# Patient Record
Sex: Female | Born: 1980 | Hispanic: Yes | Marital: Single | Smoking: Never smoker
Health system: Southern US, Community
[De-identification: ages and names within clinical notes are randomized; demographics above are authoritative.]

## PROBLEM LIST (undated history)

## (undated) HISTORY — PX: ABDOMINAL HYSTERECTOMY: SHX81

---

## 2013-10-26 ENCOUNTER — Emergency Department (HOSPITAL_COMMUNITY): Payer: No Typology Code available for payment source

## 2013-10-26 ENCOUNTER — Emergency Department (HOSPITAL_COMMUNITY)
Admission: EM | Admit: 2013-10-26 | Discharge: 2013-10-26 | Disposition: A | Discharge status: Home / Self Care | Payer: No Typology Code available for payment source | Attending: Emergency Medicine | Admitting: Emergency Medicine

## 2013-10-26 DIAGNOSIS — S161XXA Strain of muscle, fascia and tendon at neck level, initial encounter: Secondary | ICD-10-CM

## 2013-10-26 DIAGNOSIS — Y92410 Unspecified street and highway as the place of occurrence of the external cause: Secondary | ICD-10-CM | POA: Insufficient documentation

## 2013-10-26 DIAGNOSIS — S139XXA Sprain of joints and ligaments of unspecified parts of neck, initial encounter: Secondary | ICD-10-CM | POA: Insufficient documentation

## 2013-10-26 DIAGNOSIS — S298XXA Other specified injuries of thorax, initial encounter: Secondary | ICD-10-CM | POA: Insufficient documentation

## 2013-10-26 DIAGNOSIS — S6990XA Unspecified injury of unspecified wrist, hand and finger(s), initial encounter: Secondary | ICD-10-CM | POA: Insufficient documentation

## 2013-10-26 DIAGNOSIS — S335XXA Sprain of ligaments of lumbar spine, initial encounter: Secondary | ICD-10-CM | POA: Insufficient documentation

## 2013-10-26 DIAGNOSIS — S59919A Unspecified injury of unspecified forearm, initial encounter: Secondary | ICD-10-CM

## 2013-10-26 DIAGNOSIS — S59909A Unspecified injury of unspecified elbow, initial encounter: Secondary | ICD-10-CM | POA: Insufficient documentation

## 2013-10-26 DIAGNOSIS — T148XXA Other injury of unspecified body region, initial encounter: Secondary | ICD-10-CM

## 2013-10-26 DIAGNOSIS — Y9389 Activity, other specified: Secondary | ICD-10-CM | POA: Insufficient documentation

## 2013-10-26 DIAGNOSIS — S0990XA Unspecified injury of head, initial encounter: Secondary | ICD-10-CM | POA: Insufficient documentation

## 2013-10-26 DIAGNOSIS — S39012A Strain of muscle, fascia and tendon of lower back, initial encounter: Secondary | ICD-10-CM

## 2013-10-26 MED ORDER — HYDROCODONE-ACETAMINOPHEN 5-325 MG PO TABS: 2.0000 | ORAL_TABLET | ORAL | Status: DC | PRN

## 2013-10-26 MED ORDER — CYCLOBENZAPRINE HCL 10 MG PO TABS: 10.0000 mg | ORAL_TABLET | Freq: Two times a day (BID) | ORAL | Status: DC | PRN

## 2013-10-26 MED ORDER — IBUPROFEN 800 MG PO TABS
800.0000 mg | ORAL_TABLET | Freq: Once | ORAL | Status: AC
  Administered 2013-10-26: 800 mg via ORAL
  Filled 2013-10-26: qty 1

## 2013-10-26 MED ORDER — HYDROCODONE-ACETAMINOPHEN 5-325 MG PO TABS
1.0000 | ORAL_TABLET | Freq: Once | ORAL | Status: AC
  Administered 2013-10-26: 1 via ORAL
  Filled 2013-10-26: qty 1
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
## 2013-10-26 NOTE — ED Notes (Signed)
Bed: WA23 Expected date:  Expected time:  Means of arrival:  Comments: EMS 
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
## 2013-10-26 NOTE — ED Notes (Signed)
MD at bedside. Dr. Plunkett at bedside.  

## 2014-03-27 DIAGNOSIS — K5900 Constipation, unspecified: Secondary | ICD-10-CM | POA: Insufficient documentation

## 2014-03-27 DIAGNOSIS — K219 Gastro-esophageal reflux disease without esophagitis: Secondary | ICD-10-CM | POA: Insufficient documentation

## 2014-03-27 DIAGNOSIS — K625 Hemorrhage of anus and rectum: Secondary | ICD-10-CM | POA: Insufficient documentation
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
## 2014-10-27 ENCOUNTER — Ambulatory Visit (INDEPENDENT_AMBULATORY_CARE_PROVIDER_SITE_OTHER): Payer: Self-pay | Admitting: Emergency Medicine

## 2014-10-27 VITALS — BP 100/60 | HR 71 | Temp 99.1°F | Resp 16 | Ht 61.5 in | Wt 177.0 lb

## 2014-10-27 DIAGNOSIS — R519 Headache, unspecified: Secondary | ICD-10-CM

## 2014-10-27 DIAGNOSIS — R51 Headache: Secondary | ICD-10-CM

## 2014-10-27 MED ORDER — BUTALBITAL-APAP-CAFFEINE 50-325-40 MG PO TABS: 1.0000 | ORAL_TABLET | Freq: Four times a day (QID) | ORAL | Status: AC | PRN

## 2014-10-27 MED ORDER — PROMETHAZINE HCL 25 MG/ML IJ SOLN
25.0000 mg | Freq: Once | INTRAMUSCULAR | Status: AC
  Administered 2014-10-27: 25 mg via INTRAMUSCULAR

## 2014-10-27 MED ORDER — KETOROLAC TROMETHAMINE 60 MG/2ML IM SOLN
60.0000 mg | Freq: Once | INTRAMUSCULAR | Status: AC
  Administered 2014-10-27: 60 mg via INTRAMUSCULAR

## 2014-10-27 NOTE — Patient Instructions (Signed)
Cefalea migrañosa °(Migraine Headache) °Una cefalea migrañosa es un dolor intenso y punzante en uno o ambos lados de la cabeza. La migraña puede durar desde 30 minutos hasta varias horas. °CAUSAS  °No siempre se conoce la causa exacta de la cefalea migrañosa. Sin embargo, pueden aparecer cuando los nervios del cerebro se irritan y liberan ciertas sustancias químicas que causan inflamación. Esto ocasiona dolor. °Existen también ciertos factores que pueden desencadenar las migrañas, como los siguientes: °· Alcohol. °· Fumar. °· Estrés. °· La menstruación °· Quesos añejados. °· Los alimentos o las bebidas que contienen nitratos, glutamato, aspartamo o tiramina. °· Falta de sueño. °· Chocolate. °· Cafeína. °· Hambre. °· Actividad física extenuante. °· Fatiga. °· Medicamentos que se usan para tratar el dolor en el pecho (nitroglicerina), píldoras anticonceptivas, estrógeno y algunos medicamentos para la hipertensión arterial. °SIGNOS Y SÍNTOMAS °· Dolor en uno o ambos lados de la cabeza. °· Dolor pulsante o punzante. °· Dolor intenso que impide realizar las actividades diarias. °· Dolor que se agrava por cualquier actividad física. °· Náuseas, vómitos o ambos. °· Mareos. °· Dolor con la exposición a las luces brillantes, a los ruidos fuertes o la actividad. °· Sensibilidad general a las luces brillantes, a los ruidos fuertes o a los olores. °Antes de sufrir una migraña, puede recibir señales de advertencia (aura). Un aura puede incluir: °· Ver las luces intermitentes. °· Ver puntos brillantes, halos o líneas en zigzag. °· Tener una visión en túnel o visión borrosa. °· Sensación de entumecimiento u hormigueo. °· Dificultad para hablar °· Debilidad muscular. °DIAGNÓSTICO  °La cefalea migrañosa se diagnostica en función de lo siguiente: °· Síntomas. °· Examen físico. °· Una TC (tomografía computada) o resonancia magnética de la cabeza. Estas pruebas de diagnóstico por imagen no pueden diagnosticar las migrañas, pero pueden  ayudar a descartar otras causas de las cefaleas. °TRATAMIENTO °Le prescribirán medicamentos para aliviar el dolor y las náuseas. También podrán administrarse medicamentos para ayudar a prevenir las migrañas recurrentes.  °INSTRUCCIONES PARA EL CUIDADO EN EL HOGAR °· Sólo tome medicamentos de venta libre o recetados para calmar el dolor o el malestar, según las indicaciones de su médico. No se recomienda usar los opiáceos a largo plazo. °· Cuando tenga la migraña, acuéstese en un cuarto oscuro y tranquilo °· Lleve un registro diario para averiguar lo que puede provocar las cefaleas migrañosas. Por ejemplo, escriba: °¨ Lo que usted come y bebe. °¨ Cuánto tiempo duerme. °¨ Algún cambio en su dieta o en los medicamentos. °· Limite el consumo de bebidas alcohólicas. °· Si fuma, deje de hacerlo. °· Duerma entre 7 y 9 horas, o según las recomendaciones del médico. °· Limite el estrés. °· Mantenga las luces tenues si le molestan las luces brillantes y la migraña empeora. °SOLICITE ATENCIÓN MÉDICA DE INMEDIATO SI:  °· La migraña se hace cada vez más intensa. °· Tiene fiebre. °· Presenta rigidez en el cuello. °· Tiene pérdida de visión. °· Presenta debilidad muscular o pérdida del control muscular. °· Comienza a perder el equilibrio o tiene problemas para caminar. °· Sufre mareos o se desmaya. °· Tiene síntomas graves que son diferentes a los primeros síntomas. °ASEGÚRESE DE QUE:  °· Comprende estas instrucciones. °· Controlará su afección. °· Recibirá ayuda de inmediato si no mejora o si empeora. °Document Released: 08/04/2005 Document Revised: 05/25/2013 °ExitCare® Patient Information ©2015 [employer]. This information is not intended to replace advice given to you by your health care provider. Make sure you discuss any questions you have with your   health care provider. ° ° °

## 2014-10-27 NOTE — Progress Notes (Signed)
[facility] [address], [place] [id number] [phone]  Date:  10/27/2014   Name:  Shari Martinez Pallone   DOB:  08/15/1981   MRN:  [id number]  PCP:  No PCP Per Patient    Chief Complaint: Headache and Otalgia   History of Present Illness:  Shari Martinez Shults is a 34 y.o. very pleasant female patient who presents with the following:  Has headache for past three days.  Says is constant and located in occipital area.   Goes to bed with headache and awakens with headache. No neuro symptoms other than some intermittent dizziness Photophobic.  No visual symptoms. Has some nausea when dizziness worsens but no vomiting No history of head injury.  No antecedent illness No fever or chills No rash. No improvement with over the counter medications or other home remedies.  Denies other complaint or health concern today. . There are no active problems to display for this patient.   History reviewed. No pertinent past medical history.  Past Surgical History  Procedure Laterality Date  . Abdominal hysterectomy      History  Substance Use Topics  . Smoking status: Never Smoker   . Smokeless tobacco: Not on file  . Alcohol Use: Not on file    Family History  Problem Relation Age of Onset  . Diabetes Mother     No Known Allergies  Medication list has been reviewed and updated.  Current Outpatient Prescriptions on File Prior to Visit  Medication Sig Dispense Refill  . cyclobenzaprine (FLEXERIL) 10 MG tablet Take 1 tablet (10 mg total) by mouth 2 (two) times daily as needed for muscle spasms. (Patient not taking: Reported on 10/27/2014) 20 tablet 0  . HYDROcodone-acetaminophen (NORCO/VICODIN) 5-325 MG per tablet Take 2 tablets by mouth every 4 (four) hours as needed. (Patient not taking: Reported on 10/27/2014) 6 tablet 0   No current facility-administered medications on file prior to visit.    Review of Systems:  revi   Physical Examination: Filed Vitals:    10/27/14 1700  BP: 100/60  Pulse: 71  Temp: 99.1 F (37.3 C)  Resp: 16   Filed Vitals:   10/27/14 1700  Height: 5' 1.5" (1.562 m)  Weight: 177 lb (80.287 kg)   Body mass index is 32.91 kg/(m^2). Ideal Body Weight: Weight in (lb) to have BMI = 25: 134.2  GEN: WDWN, NAD, Non-toxic, A & O x 3 HEENT: Atraumatic, Normocephalic. Neck supple. No masses, No LAD. Ears and Nose: No external deformity. CV: RRR, No M/G/R. No JVD. No thrill. No extra heart sounds. PULM: CTA B, no wheezes, crackles, rhonchi. No retractions. No resp. distress. No accessory muscle use. ABD: S, NT, ND, +BS. No rebound. No HSM. EXTR: No c/c/e NEURO Normal gait. PRRERLA EOMI  CN 2-12 intact.  Tandem gait, romberg normal PSYCH: Normally interactive. Conversant. Not depressed or anxious appearing.  Calm demeanor.    Assessment and Plan: Headache likely  Migraine toradol and promethazine Relief with injection   Signed,  Phillips OdorJeffery Kyliah Deanda, MD

## 2015-03-17 ENCOUNTER — Ambulatory Visit (INDEPENDENT_AMBULATORY_CARE_PROVIDER_SITE_OTHER): Payer: Self-pay | Admitting: Family Medicine

## 2015-03-17 VITALS — BP 102/62 | HR 80 | Temp 98.2°F | Resp 16 | Ht 62.75 in | Wt 176.0 lb

## 2015-03-17 DIAGNOSIS — R51 Headache: Secondary | ICD-10-CM

## 2015-03-17 DIAGNOSIS — R519 Headache, unspecified: Secondary | ICD-10-CM

## 2015-03-17 DIAGNOSIS — H02846 Edema of left eye, unspecified eyelid: Secondary | ICD-10-CM

## 2015-03-17 DIAGNOSIS — R454 Irritability and anger: Secondary | ICD-10-CM

## 2015-03-17 LAB — POCT CBC
Granulocyte percent: 66.3 %G (ref 37–80)
HCT, POC: 41.2 % (ref 37.7–47.9)
Hemoglobin: 13.9 g/dL (ref 12.2–16.2)
Lymph, poc: 2.7 (ref 0.6–3.4)
MCH, POC: 29 pg (ref 27–31.2)
MCHC: 33.8 g/dL (ref 31.8–35.4)
MCV: 85.8 fL (ref 80–97)
MID (CBC): 0.8 (ref 0–0.9)
MPV: 7.5 fL (ref 0–99.8)
POC GRANULOCYTE: 6.9 (ref 2–6.9)
POC LYMPH %: 26.4 % (ref 10–50)
POC MID %: 7.3 %M (ref 0–12)
Platelet Count, POC: 316 10*3/uL (ref 142–424)
RBC: 4.81 M/uL (ref 4.04–5.48)
RDW, POC: 14.4 %
WBC: 10.4 10*3/uL — AB (ref 4.6–10.2)

## 2015-03-17 LAB — GLUCOSE, POCT (MANUAL RESULT ENTRY): POC Glucose: 107 mg/dl — AB (ref 70–99)

## 2015-03-17 MED ORDER — PREDNISONE 20 MG PO TABS: ORAL_TABLET | ORAL | Status: AC

## 2015-03-17 MED ORDER — CITALOPRAM HYDROBROMIDE 20 MG PO TABS: 20.0000 mg | ORAL_TABLET | Freq: Every day | ORAL | Status: AC

## 2015-03-17 MED ORDER — AMOXICILLIN 500 MG PO CAPS: 1000.0000 mg | ORAL_CAPSULE | Freq: Two times a day (BID) | ORAL | Status: AC

## 2015-03-17 NOTE — Patient Instructions (Addendum)
Orzuelo (Sty) Se trata de una infeccin en una glndula del prpado, ubicada en la base de una pestaa. Una orzuelo puede desarrollar un punto de pus blanco o amarillo. Puede inflamarse. Generalmente el orzuelo se abre y el pus comienza a salir espontneamente. Una vez que drenan, no dejan bulto en el prpado. Un orzuelo a menudo se confunde con otra forma de quiste del prpado que se denomina chalazion. El chalazion aparece dentro del prpado y no en el borde en el que se encuentran las bases de las pestaas. A menudo son rojizos, duelen y forman bultos en el prpado. CAUSAS  Grmenes (bacterias).  Inflamacin del prpado de larga duracin (crnica). SNTOMAS  Molestias, enrojecimiento e inflamacin en el borde del prpado en la base de las pestaas.  A veces puede desarrollar un punto de pus blanco o amarillo. Puede drenar o no. DIAGNSTICO Un oftalmlogo podr distinguir entre un orzuelo y un chalazin y tratar la enfermedad.  TRATAMIENTO  Los orzuelos normalmente se tratan con compresas calientes hasta que drenen.  En pocos caos, el profesional que lo asiste podr prescribirle medicamentos que destruyen grmenes (antibiticos). Estos antibiticos podrn prescribirse en forma de gotas, cremas o pldoras.  Si se forma un bulto duro, en general ser necesario realizar una pequea incisin y eliminar la parte endurecida del quiste en un procedimiento de ciruga menor que se realiza en el consultorio.  En algunos casos, el mdico podr enviar el contenido del quiste al laboratorio para asegurarse de que no es una forma de cncer rara pero peligroso de las glndulas del prpado. INSTRUCCIONES PARA EL CUIDADO DOMICILIARIO  Lave sus manos con frecuencia y squelas con una toalla limpia. Evite tocarse el prpado. Esto puede diseminar la infeccin a otras partes del ojo.  Aplique calor sobre el prpado durante 10 a 20 minutos varias veces por da para aliviar el dolor y ayudar a que se cure  ms rpidamente.  No apriete el orzuelo. Permita que drene slo. Lvese el prpado cuidadosamente 3  4 veces por da para retirar el pus. SOLICITE ATENCIN MDICA DE INMEDIATO SI:  Comienza a sentir dolor en el ojo, o se le hincha.  La visin se modifica.  El orzuelo no drena por s mismo en 3 das.  El orzuelo aparece nuevamente despus de un breve perodo, an con tratamiento.  Observa enrojecimiento (inflamacin) alrededor del ojo.  Tiene fiebre. Document Released: 05/14/2005 Document Revised: 10/27/2011 ExitCare Patient Information 2015 [employer]. This information is not intended to replace advice given to you by your health care provider. Make sure you discuss any questions you have with your health care provider.  

## 2015-03-17 NOTE — Progress Notes (Signed)
Subjective:    Patient ID: Shari Martinez, female    DOB: November 30, 1980, 34 y.o.   MRN: [id number]  03/17/2015  Facial Pain; Blurred Vision; and Headache   HPI This 33 y.o. female presents for evaluation of facial pain, headache, blurred vision.  Onset of symptoms two days ago.  No fever/chills/sweats.  +HA posterior L occipital region.  +blurred vision L eye.  No eye redness or drainage.  +lower eyelid swelling. Denies ear pain, sore throat, rhinorrhea, nasal congestion, cough.  Denies rash.  +L sided facial pain extends from L lower eyelid to L lower gumline. No facial swelling except at location of L lower lid.  No n/v.  No rash along face. No facial weakness.  No n/t/burning along face.  Has suffered with recurrent similar symptoms of R eyelid and R facial area.  Previous visit for symptoms in the past 10/27/2014; treated for acute headache syndrome with Toradol and Phenergan.  Irritability/anxiety: admits to excessive irritability and being short-tempered for the past two months. Denies depressive symptoms.  Denies change in appetite. Denies SI/HI. No history of depression or anxiety; no history of therapy or psychotherapy.  Admits to excessive worry regarding children.  No difficulties with sleeping.  Feels that some headaches secondary to excessive worry and stress.   Review of Systems  Constitutional: Negative for fever, chills, diaphoresis and fatigue.  HENT: Positive for tinnitus. Negative for congestion, drooling, ear discharge, ear pain, facial swelling, mouth sores, postnasal drip, rhinorrhea, sinus pressure, sore throat and trouble swallowing.   Respiratory: Negative for cough.   Cardiovascular: Negative for chest pain, palpitations and leg swelling.  Neurological: Positive for headaches. Negative for dizziness, tremors, seizures, syncope, facial asymmetry, speech difficulty, weakness, light-headedness and numbness.  Psychiatric/Behavioral: Negative for suicidal ideas, sleep  disturbance, self-injury and dysphoric mood. The patient is nervous/anxious.     History reviewed. No pertinent past medical history. Past Surgical History  Procedure Laterality Date  . Abdominal hysterectomy     No Known Allergies History   Social History  . Marital Status: Single    Spouse Name: N/A  . Number of Children: N/A  . Years of Education: N/A   Occupational History  . Not on file.   Social History Main Topics  . Smoking status: Never Smoker   . Smokeless tobacco: Not on file  . Alcohol Use: Not on file  . Drug Use: Not on file  . Sexual Activity: Not on file   Other Topics Concern  . Not on file   Social History Narrative   Family History  Problem Relation Age of Onset  . Diabetes Mother         Objective:    BP 102/62 mmHg  Pulse 80  Temp(Src) 98.2 F (36.8 C) (Oral)  Resp 16  Ht 5' 2.75" (1.594 m)  Wt 176 lb (79.833 kg)  BMI 31.42 kg/m2  SpO2 98% Physical Exam  Constitutional: She is oriented to person, place, and time. She appears well-developed and well-nourished. No distress.  HENT:  Head: Normocephalic and atraumatic.  Nose: Right sinus exhibits no maxillary sinus tenderness and no frontal sinus tenderness. Left sinus exhibits maxillary sinus tenderness. Left sinus exhibits no frontal sinus tenderness.  No TTP along TMJ regions B.  No TTP along L upper and lower gumlines. No fluctuants or swelling along gum lines upper and lower L.  Eyes: Conjunctivae and EOM are normal. Pupils are equal, round, and reactive to light. Right eye exhibits no discharge. Left eye exhibits  no discharge.  Mild lower lid swelling L with mild erythema lower lid and TTP along medial>lateral aspect of lower lid.  Neck: Normal range of motion. Neck supple.  Cardiovascular: Normal rate, regular rhythm and normal heart sounds.  Exam reveals no gallop and no friction rub.   No murmur heard. Pulmonary/Chest: Effort normal and breath sounds normal. She has no wheezes.  She has no rales.  Neurological: She is alert and oriented to person, place, and time. No cranial nerve deficit. She exhibits normal muscle tone. Coordination normal.  Skin: Skin is warm and dry. No rash noted. She is not diaphoretic. There is erythema.  Psychiatric: She has a normal mood and affect. Her behavior is normal.  Nursing note and vitals reviewed.  No results found for this or any previous visit.     Assessment & Plan:   1. Facial pain   2. Swelling of eyelid, left   3. Irritability     1. L facial pain: New. Associated with L lower lid swelling.  Ddx includes cellulitis of lid associated with early stye, trigeminal neuralgia, early zoster, atypical sinusitis.  Treat with Amoxicillin and Prednisone.  RTC for acute worsening. 2. L lower eyelid swelling:  New.  Concern for early stye versus cellulitis early mild.  Treat with warm compresses bid for 15 minutes and  Prednisone. 3.  Irritability/anxiety: New. Rx for Celexa 20mg  daily provided.   RTC six weeks.   Meds ordered this encounter  Medications  . amoxicillin (AMOXIL) 500 MG capsule    Sig: Take 2 capsules (1,000 mg total) by mouth 2 (two) times daily.    Dispense:  40 capsule    Refill:  0  . predniSONE (DELTASONE) 20 MG tablet    Sig: Two tablets daily x 5 days then one tablet daily x 5 days    Dispense:  15 tablet    Refill:  0  . citalopram (CELEXA) 20 MG tablet    Sig: Take 1 tablet (20 mg total) by mouth daily.    Dispense:  30 tablet    Refill:  5    No Follow-up on file.   Mirelle Biskup Paulita Fujita, M.D. [facility] [address] [place], [place]  [address] [phone] phone [phone] fax

## 2015-04-10 ENCOUNTER — Ambulatory Visit (INDEPENDENT_AMBULATORY_CARE_PROVIDER_SITE_OTHER): Payer: Self-pay | Admitting: Physician Assistant

## 2015-04-10 VITALS — BP 124/80 | HR 79 | Temp 99.7°F | Resp 18 | Ht 63.0 in | Wt 180.0 lb

## 2015-04-10 DIAGNOSIS — K921 Melena: Secondary | ICD-10-CM

## 2015-04-10 DIAGNOSIS — R1030 Lower abdominal pain, unspecified: Secondary | ICD-10-CM

## 2015-04-10 DIAGNOSIS — F439 Reaction to severe stress, unspecified: Secondary | ICD-10-CM

## 2015-04-10 DIAGNOSIS — K219 Gastro-esophageal reflux disease without esophagitis: Secondary | ICD-10-CM

## 2015-04-10 DIAGNOSIS — Z658 Other specified problems related to psychosocial circumstances: Secondary | ICD-10-CM

## 2015-04-10 LAB — POCT CBC
GRANULOCYTE PERCENT: 64.2 % (ref 37–80)
HCT, POC: 39.3 % (ref 37.7–47.9)
HEMOGLOBIN: 12.4 g/dL (ref 12.2–16.2)
Lymph, poc: 2.9 (ref 0.6–3.4)
MCH: 26.9 pg — AB (ref 27–31.2)
MCHC: 31.6 g/dL — AB (ref 31.8–35.4)
MCV: 85.1 fL (ref 80–97)
MID (cbc): 0.7 (ref 0–0.9)
MPV: 7.3 fL (ref 0–99.8)
PLATELET COUNT, POC: 316 10*3/uL (ref 142–424)
POC Granulocyte: 6.4 (ref 2–6.9)
POC LYMPH PERCENT: 29.1 %L (ref 10–50)
POC MID %: 6.7 %M (ref 0–12)
RBC: 4.62 M/uL (ref 4.04–5.48)
RDW, POC: 13.5 %
WBC: 9.9 10*3/uL (ref 4.6–10.2)

## 2015-04-10 LAB — POC HEMOCCULT BLD/STL (OFFICE/1-CARD/DIAGNOSTIC): Fecal Occult Blood, POC: POSITIVE — AB

## 2015-04-10 MED ORDER — ESCITALOPRAM OXALATE 10 MG PO TABS: 10.0000 mg | ORAL_TABLET | Freq: Every day | ORAL | Status: DC

## 2015-04-10 MED ORDER — PANTOPRAZOLE SODIUM 40 MG PO TBEC: 40.0000 mg | DELAYED_RELEASE_TABLET | Freq: Every day | ORAL | Status: DC

## 2015-04-10 MED ORDER — RANITIDINE HCL 150 MG PO TABS: 150.0000 mg | ORAL_TABLET | Freq: Two times a day (BID) | ORAL | Status: AC

## 2015-04-10 NOTE — Patient Instructions (Signed)
Reflujo gastroesofágico - Adultos  °(Gastroesophageal Reflux Disease, Adult) ° El reflujo gastroesofágico ocurre cuando el ácido del estómago pasa al esófago. Cuando el ácido entra en contacto con el esófago, el ácido provoca dolor (inflamación) en el esófago. Con el tiempo, pueden formarse pequeños agujeros (úlceras) en el revestimiento del esófago. °CAUSAS  °· Exceso de peso corporal. Esto aplica presión sobre el estómago, lo que hace que el ácido del estómago suba hacia el esófago. °· El hábito de fumar Aumenta la producción de ácido en el estómago. °· El consumo de alcohol. Provoca disminución de la presión en el esfínter esofágico inferior (válvula o anillo de músculo entre el esófago y el estómago), permitiendo que el ácido del estómago suba hacia el esófago. °· Cenas a última hora del día y estómago lleno. Aumenta la presión y la producción de ácido en el estómago. °· Malformación en el esfínter esofágico inferior. °A menudo no se halla causa.  °SÍNTOMAS  °· Ardor y dolor en la parte inferior del pecho detrás del esternón y en la zona media del estómago. Puede ocurrir dos veces por semana o más a menudo. °· Dificultad para tragar. °· Dolor de garganta. °· Tos seca. °· Síntomas similares al asma que incluyen sensación de opresión en el pecho, falta de aire y sibilancias. °DIAGNÓSTICO  °El médico diagnosticará el problema basándose en los síntomas. En algunos casos, se indican radiografías y otras pruebas para verificar si hay complicaciones o para comprobar el estado del estómago y el esófago.  °TRATAMIENTO  °El médico le indicará medicamentos de venta libre o recetados para ayudar a disminuir la producción de ácido. Consulte con su médico antes de empezar o agregar cualquier medicamento nuevo.  °INSTRUCCIONES PARA EL CUIDADO EN EL HOGAR  °· Modifique los factores que pueda cambiar. Consulte con su médico para solicitar orientación relacionada con la pérdida de peso, dejar de fumar y el consumo de  alcohol. °· Evite las comidas y bebidas que empeoran los problemas, como: °¨ Bebidas con cafeína o alcohólicas. °¨ Chocolate. °¨ Sabores a menta. °¨ Ajo y cebolla. °¨ Comidas muy condimentadas. °¨ Cítricos como naranjas, limones o limas. °¨ Alimentos que contengan tomate, como salsas, chile y pizza. °¨ Alimentos fritos y grasos. °· Evite acostarse durante 3 horas antes de irse a dormir o antes de tomar una siesta. °· Haga comidas pequeñas durante el día en lugar de 3 comidas abundantes. °· Use ropas sueltas. No use nada apretado alrededor de la cintura que cause presión en el estómago. °· Levante (eleve) la cabecera de la cama 6 a 8 pulgadas (15 a 20 cm) con bloques de madera. Usar almohadas extra no ayuda. °· Solo tome medicamentos que se pueden comprar sin receta o recetados para el dolor, malestar o fiebre, como le indica el médico. °· No tome aspirina, ibuprofeno ni antiinflamatorios no esteroides. °SOLICITE ATENCIÓN MÉDICA DE INMEDIATO SI:  °· Siente dolor en los brazos, el cuello, la mandíbula, los dientes o la espalda. °· El dolor aumenta o cambia la intensidad o la duranción. °· Tiene náuseas, vómitos o sudoración(diaforesis). °· Siente falta de aire o dolor en el pecho, o se desmaya. °· Vomita y el vómito tiene sangre, es de color verde, amarillo, negro o es similar a la borra del café o tiene sangre. °· Las heces son rojas, sanguinolentas o negras. °Estos síntomas pueden ser signos de otros problemas, como enfermedades cardíacas, hemorragias gástrias o sangrado esofágico.  °ASEGÚRESE DE QUE:  °· Comprende estas instrucciones. °· Controlará su enfermedad. °·   Solicitará ayuda de inmediato si no mejora o si empeora. °Document Released: 05/14/2005 Document Revised: 10/27/2011 °Exit[employer]® Patient Information ©2015 [employer]. This information is not intended to replace advice given to you by your health care provider. Make sure you discuss any questions you have with your health care provider. ° °

## 2015-04-10 NOTE — Progress Notes (Signed)
Subjective:    Patient ID: Shari Martinez, female    DOB: 1981/08/02, 34 y.o.   MRN: [id number]  HPI Patient presents with niece who translates for abdominal pain and bleeding from rectum. Abdominal pain has been present for past week and is sometimes sharp and sometimes burns. Has had bleeding with bowel movements for past 2 days. Stools look the same color but has copious amount of blood in toilet. When urinates sometimes gets drops of blood from rectum as well. Has had an isolated episode of diarrhea following 2 days of no bowel movements. Generally has soft bowel movements daily. Abdominal pain is throughout day, but is worse following meals. Eats a lot of spicy food, food with tomatoes, and drinks sodas with several meals. Has had acid reflux several years ago, but has not bothered her in a while so does not take any medication. Had colonoscopy in the past (unsure of reason) and internal hemorrhoids were removed. No polyps were found. NKDA.  Was started on Celexa for stress and anxiety 2 months ago, but discontinued medication due to headaches and would like to restart something else today. Did not feel like stress level was better when she was taking. Took medication for 2 weeks.   Review of Systems  Constitutional: Positive for appetite change. Negative for fever, chills, diaphoresis, fatigue and unexpected weight change.  Gastrointestinal: Positive for nausea, abdominal pain, diarrhea (resolved), constipation (resolved), blood in stool and anal bleeding. Negative for vomiting, abdominal distention and rectal pain.  Genitourinary: Negative.   Neurological: Positive for headaches. Negative for dizziness.  Psychiatric/Behavioral: Negative for suicidal ideas, sleep disturbance and dysphoric mood. The patient is nervous/anxious.        Objective:   Physical Exam  Constitutional: She is oriented to person, place, and time. She appears well-developed and well-nourished. No distress.  Blood  pressure 124/80, pulse 79, temperature 99.7 F (37.6 C), resp. rate 18, height  (1.6 m), weight 180 lb (81.647 kg), SpO2 99 %.   HENT:  Head: Normocephalic and atraumatic.  Right Ear: External ear normal.  Left Ear: External ear normal.  Eyes: Conjunctivae are normal. Right eye exhibits no discharge. Left eye exhibits no discharge. No scleral icterus.  Neck: Neck supple.  Cardiovascular: Normal rate, regular rhythm, normal heart sounds and intact distal pulses.  Exam reveals no gallop and no friction rub.   No murmur heard. Pulmonary/Chest: Effort normal and breath sounds normal. No respiratory distress. She has no wheezes. She has no rales. She exhibits no tenderness.  Abdominal: Soft. Normal appearance and bowel sounds are normal. She exhibits no shifting dullness and no distension. There is tenderness in the epigastric area and periumbilical area. There is no rigidity, no rebound, no guarding, no CVA tenderness, no tenderness at McBurney's point and negative Murphy's sign. No hernia.  Genitourinary: Rectal exam shows external hemorrhoid (very small, non-thrombosed, non-erythematous) and tenderness (Tender at 9:00 position, but no abnormalities felt). Rectal exam shows no internal hemorrhoid, no fissure, no mass and anal tone normal. Guaiac positive stool.  Lymphadenopathy:    She has no cervical adenopathy.  Neurological: She is alert and oriented to person, place, and time.  Skin: Skin is warm and dry. No rash noted. She is not diaphoretic. No erythema. No pallor.  Psychiatric: She has a normal mood and affect. Her behavior is normal. Judgment and thought content normal.   Results for orders placed or performed in visit on 04/10/15  POC Hemoccult Bld/Stl (1-Cd Office Dx)  Result  Value Ref Range   Card #1 Date 04/10/2015    Fecal Occult Blood, POC Positive (A) Negative  POCT CBC  Result Value Ref Range   WBC 9.9 4.6 - 10.2 K/uL   Lymph, poc 2.9 0.6 - 3.4   POC LYMPH PERCENT 29.1  10 - 50 %L   MID (cbc) 0.7 0 - 0.9   POC MID % 6.7 0 - 12 %M   POC Granulocyte 6.4 2 - 6.9   Granulocyte percent 64.2 37 - 80 %G   RBC 4.62 4.04 - 5.48 M/uL   Hemoglobin 12.4 12.2 - 16.2 g/dL   HCT, POC 16.1 09.6 - 47.9 %   MCV 85.1 80 - 97 fL   MCH, POC 26.9 (A) 27 - 31.2 pg   MCHC 31.6 (A) 31.8 - 35.4 g/dL   RDW, POC 04.5 %   Platelet Count, POC 316 142 - 424 K/uL   MPV 7.3 0 - 99.8 fL      Assessment & Plan:  1. Gastroesophageal reflux disease without esophagitis 2. Blood in stool 3. Lower abdominal pain With past GERD history, bleeding possibly related to ulcer. Patient states that she cannot afford colonoscopy at this time due to lack of insurance. Will see if any improvement or cessation of bleeding with tx with PPI and H2 blocker, however, think that GI referral is warranted to see if they have a payment plan.  - pantoprazole (PROTONIX) 40 MG tablet; Take 1 tablet (40 mg total) by mouth daily.  Dispense: 30 tablet; Refill: 3 - ranitidine (ZANTAC) 150 MG tablet; Take 1 tablet (150 mg total) by mouth 2 (two) times daily.  Dispense: 60 tablet; Refill: 1 - POC Hemoccult Bld/Stl (1-Cd Office Dx) - POCT CBC - Ambulatory referral to Gastroenterology  4. Stress RTC 4-6 weeks for re-eval. Start with half pill for 1 week then increase to whole pill.  - escitalopram (LEXAPRO) 10 MG tablet; Take 1 tablet (10 mg total) by mouth daily.  Dispense: 30 tablet; Refill: 1   Bexton Haak PA-C  [facility] 04/11/2015 1:02 PM

## 2015-04-11 DIAGNOSIS — K219 Gastro-esophageal reflux disease without esophagitis: Secondary | ICD-10-CM | POA: Insufficient documentation

## 2015-05-14 ENCOUNTER — Encounter: Payer: Self-pay | Admitting: Physician Assistant

## 2018-11-05 ENCOUNTER — Emergency Department (HOSPITAL_COMMUNITY)
Admission: EM | Admit: 2018-11-05 | Discharge: 2018-11-05 | Disposition: A | Discharge status: Home / Self Care | Payer: Self-pay | Attending: Emergency Medicine | Admitting: Emergency Medicine

## 2018-11-05 ENCOUNTER — Encounter (HOSPITAL_COMMUNITY): Payer: Self-pay | Admitting: Pharmacy Technician

## 2018-11-05 ENCOUNTER — Other Ambulatory Visit: Payer: Self-pay

## 2018-11-05 ENCOUNTER — Emergency Department (HOSPITAL_COMMUNITY): Payer: Self-pay

## 2018-11-05 ENCOUNTER — Other Ambulatory Visit (HOSPITAL_COMMUNITY): Payer: Self-pay

## 2018-11-05 DIAGNOSIS — R1013 Epigastric pain: Secondary | ICD-10-CM | POA: Insufficient documentation

## 2018-11-05 DIAGNOSIS — R52 Pain, unspecified: Secondary | ICD-10-CM

## 2018-11-05 DIAGNOSIS — K219 Gastro-esophageal reflux disease without esophagitis: Secondary | ICD-10-CM

## 2018-11-05 LAB — COMPREHENSIVE METABOLIC PANEL
ALT: 22 U/L (ref 0–44)
AST: 17 U/L (ref 15–41)
Albumin: 3.8 g/dL (ref 3.5–5.0)
Alkaline Phosphatase: 49 U/L (ref 38–126)
Anion gap: 7 (ref 5–15)
BILIRUBIN TOTAL: 0.7 mg/dL (ref 0.3–1.2)
BUN: 11 mg/dL (ref 6–20)
CALCIUM: 8.8 mg/dL — AB (ref 8.9–10.3)
CO2: 24 mmol/L (ref 22–32)
CREATININE: 0.67 mg/dL (ref 0.44–1.00)
Chloride: 109 mmol/L (ref 98–111)
GFR calc Af Amer: >60 mL/min (ref >60)
GFR calc non Af Amer: >60 mL/min (ref >60)
GLUCOSE: 99 mg/dL (ref 70–99)
Potassium: 3.9 mmol/L (ref 3.5–5.1)
Sodium: 140 mmol/L (ref 135–145)
TOTAL PROTEIN: 7 g/dL (ref 6.5–8.1)

## 2018-11-05 LAB — LIPASE, BLOOD: Lipase: 23 U/L (ref 11–51)

## 2018-11-05 LAB — CBC WITH DIFFERENTIAL/PLATELET
Abs Immature Granulocytes: 0.03 10*3/uL (ref 0.00–0.07)
Basophils Absolute: 0 10*3/uL (ref 0.0–0.1)
Basophils Relative: 0 %
EOS PCT: 1 %
Eosinophils Absolute: 0.1 10*3/uL (ref 0.0–0.5)
HCT: 39.3 % (ref 36.0–46.0)
HEMOGLOBIN: 13.1 g/dL (ref 12.0–15.0)
Immature Granulocytes: 0 %
LYMPHS PCT: 28 %
Lymphs Abs: 2.6 10*3/uL (ref 0.7–4.0)
MCH: 29.7 pg (ref 26.0–34.0)
MCHC: 33.3 g/dL (ref 30.0–36.0)
MCV: 89.1 fL (ref 80.0–100.0)
MONOS PCT: 8 %
Monocytes Absolute: 0.8 10*3/uL (ref 0.1–1.0)
Neutro Abs: 5.9 10*3/uL (ref 1.7–7.7)
Neutrophils Relative %: 63 %
Platelets: 292 10*3/uL (ref 150–400)
RBC: 4.41 MIL/uL (ref 3.87–5.11)
RDW: 12.9 % (ref 11.5–15.5)
WBC: 9.5 10*3/uL (ref 4.0–10.5)
nRBC: 0 % (ref 0.0–0.2)

## 2018-11-05 MED ORDER — FAMOTIDINE 20 MG IN NS 100 ML IVPB
20.0000 mg | Freq: Once | INTRAVENOUS | Status: AC
  Administered 2018-11-05: 20 mg via INTRAVENOUS
  Filled 2018-11-05: qty 100

## 2018-11-05 MED ORDER — ONDANSETRON HCL 4 MG/2ML IJ SOLN
4.0000 mg | Freq: Once | INTRAMUSCULAR | Status: AC
  Administered 2018-11-05: 4 mg via INTRAVENOUS
  Filled 2018-11-05: qty 2

## 2018-11-05 MED ORDER — ALUM & MAG HYDROXIDE-SIMETH 200-200-20 MG/5ML PO SUSP
30.0000 mL | Freq: Once | ORAL | Status: AC
  Administered 2018-11-05: 30 mL via ORAL
  Filled 2018-11-05: qty 30

## 2018-11-05 MED ORDER — SUCRALFATE 1 G PO TABS: 1.0000 g | ORAL_TABLET | Freq: Three times a day (TID) | ORAL | 0 refills | Status: AC

## 2018-11-05 MED ORDER — SODIUM CHLORIDE 0.9 % IV BOLUS
1000.0000 mL | Freq: Once | INTRAVENOUS | Status: AC
  Administered 2018-11-05: 1000 mL via INTRAVENOUS

## 2018-11-05 MED ORDER — PANTOPRAZOLE SODIUM 40 MG PO TBEC
40.0000 mg | DELAYED_RELEASE_TABLET | Freq: Every day | ORAL | 0 refills | Status: AC
Start: 2018-11-05 — End: ?

## 2018-11-05 NOTE — Discharge Instructions (Signed)
Hay inflamacion en su estomago.  Por favor toma la medicina y observar su condicion.  Si hay algo neuvo en su condicion o tiene cambios que parecen raros o peligrosos, regresa aqui inmediatamente.

## 2018-11-05 NOTE — ED Provider Notes (Signed)
[facility] EMERGENCY DEPARTMENT Provider Note   CSN: [id number] Arrival date & time: 11/05/18  1300    History   Chief Complaint Chief Complaint  Patient presents with  . Abdominal Pain    HPI Shari Martinez is a 38 y.o. female.     HPI Patient presents with concern of abdominal pain, nausea, chills. Onset was 3 days ago. Since onset symptoms have been persistent, with pain focally in the epigastrium, with occasional radiation to the right upper abdomen, and back. There is associated nausea, anorexia, and she has had several episodes of vomiting She has also had one episode of loose stool, within the past 24 hours. No objective fever though she does describe chilled sensation. Patient states that she is generally well, has no history of surgery. Patient has taken several doses of diclofenac without relief of the symptoms.  Earlier today the patient went to a community clinic, and after describing abdominal pain, with concern for acute pathology such as cholecystitis she was referred here for evaluation. History reviewed. No pertinent past medical history.  Patient Active Problem List   Diagnosis Date Noted  . Gastroesophageal reflux disease without esophagitis 04/11/2015    Past Surgical History:  Procedure Laterality Date  . ABDOMINAL HYSTERECTOMY       OB History   No obstetric history on file.      Home Medications    Prior to Admission medications   Medication Sig Start Date End Date Taking? Authorizing Provider  amoxicillin (AMOXIL) 500 MG capsule Take 2 capsules (1,000 mg total) by mouth 2 (two) times daily. Patient not taking: Reported on 04/10/2015 03/17/15   Ethelda Chick, MD  citalopram (CELEXA) 20 MG tablet Take 1 tablet (20 mg total) by mouth daily. Patient not taking: Reported on 04/10/2015 03/17/15   Ethelda Chick, MD  cyclobenzaprine (FLEXERIL) 10 MG tablet Take 1 tablet (10 mg total) by mouth 2 (two) times daily as needed for  muscle spasms. Patient not taking: Reported on 10/27/2014 10/26/13   Gwyneth Sprout, MD  escitalopram (LEXAPRO) 10 MG tablet Take 1 tablet (10 mg total) by mouth daily. Patient not taking: Reported on 11/05/2018 04/10/15   Brewington, Tishira R, PA-C  HYDROcodone-acetaminophen (NORCO/VICODIN) 5-325 MG per tablet Take 2 tablets by mouth every 4 (four) hours as needed. Patient not taking: Reported on 10/27/2014 10/26/13   Gwyneth Sprout, MD  pantoprazole (PROTONIX) 40 MG tablet Take 1 tablet (40 mg total) by mouth daily. Patient not taking: Reported on 11/05/2018 04/10/15   Brewington, Tishira R, PA-C  predniSONE (DELTASONE) 20 MG tablet Two tablets daily x 5 days then one tablet daily x 5 days Patient not taking: Reported on 04/10/2015 03/17/15   Ethelda Chick, MD  ranitidine (ZANTAC) 150 MG tablet Take 1 tablet (150 mg total) by mouth 2 (two) times daily. Patient not taking: Reported on 11/05/2018 04/10/15   Brewington, Arta Bruce, PA-C    Family History Family History  Problem Relation Age of Onset  . Diabetes Mother     Social History Social History   Tobacco Use  . Smoking status: Never Smoker  Substance Use Topics  . Alcohol use: Not on file  . Drug use: Not on file     Allergies   Patient has no known allergies.   Review of Systems Review of Systems  Constitutional:       Per HPI, otherwise negative  HENT:       Per HPI, otherwise negative  Respiratory:  Per HPI, otherwise negative  Cardiovascular:       Per HPI, otherwise negative  Gastrointestinal: Positive for abdominal pain, diarrhea, nausea and vomiting.  Endocrine:       Negative aside from HPI  Genitourinary:       Neg aside from HPI   Musculoskeletal:       Per HPI, otherwise negative  Skin: Negative.   Neurological: Negative for syncope.     Physical Exam Updated Vital Signs BP 112/71   Pulse 75   Temp 98.1 F (36.7 C) (Oral)   Resp 18   SpO2 99%   Physical Exam Vitals signs and nursing  note reviewed.  Constitutional:      General: She is not in acute distress.    Appearance: She is well-developed.  HENT:     Head: Normocephalic and atraumatic.  Eyes:     Conjunctiva/sclera: Conjunctivae normal.  Cardiovascular:     Rate and Rhythm: Normal rate and regular rhythm.  Pulmonary:     Effort: Pulmonary effort is normal. No respiratory distress.     Breath sounds: Normal breath sounds. No stridor.  Abdominal:     General: There is no distension.     Tenderness: There is abdominal tenderness in the epigastric area.  Skin:    General: Skin is warm and dry.  Neurological:     Mental Status: She is alert and oriented to person, place, and time.     Cranial Nerves: No cranial nerve deficit.      ED Treatments / Results  Labs (all labs ordered are listed, but only abnormal results are displayed) Labs Reviewed  COMPREHENSIVE METABOLIC PANEL - Abnormal; Notable for the following components:      Result Value   Calcium 8.8 (*)    All other components within normal limits  LIPASE, BLOOD  CBC WITH DIFFERENTIAL/PLATELET    EKG None  Radiology US Abdomen Limited Ruq  Result Date: 11/05/2018 CLINICAL DATA:  Epigastric pain for 3 days. EXAM: ULTRASOUND ABDOMEN LIMITED RIGHT UPPER QUADRANT COMPARISON:  None. FINDINGS: Gallbladder: No gallstones or wall thickening visualized. No sonographic Murphy sign noted by sonographer. Common bile duct: Diameter: 0.4 cm Liver: No focal lesion. Echogenicity is diffusely increased. Portal vein is patent on color Doppler imaging with normal direction of blood flow towards the liver. IMPRESSION: No acute abnormality.  Negative for gallstones. Fatty infiltration of the liver. Electronically Signed   By: Drusilla Kanner M.D.   On: 11/05/2018 14:03    Procedures Procedures (including critical care time)  Medications Ordered in ED Medications  sodium chloride 0.9 % bolus 1,000 mL (1,000 mLs Intravenous New Bag/Given 11/05/18 1342)   famotidine (PEPCID) IVPB 20 mg in NS 100 mL IVPB (20 mg Intravenous New Bag/Given 11/05/18 1342)  alum & mag hydroxide-simeth (MAALOX/MYLANTA) 200-200-20 MG/5ML suspension 30 mL (30 mLs Oral Given 11/05/18 1343)  ondansetron (ZOFRAN) injection 4 mg (4 mg Intravenous Given 11/05/18 1341)     Initial Impression / Assessment and Plan / ED Course  I have reviewed the triage vital signs and the nursing notes.  Pertinent labs & imaging results that were available during my care of the patient were reviewed by me and considered in my medical decision making (see chart for details).        3:28 PM On repeat exam the patient is awake and alert, hemodynamically unremarkable.  On she continues to complain of some pain in her gastrum. Findings reassuring, no evidence for gallstones, cholecystitis, labs reassuring, no  substantial electrolyte abnormalities, no evidence for pancreatitis. Patient has a noted history of GERD, not currently taking any medication for acid suppression. We discussed initiation of multiple medication, outpatient follow-up, to which she is amenable. She has received fluid resuscitation and Pepcid, IV here prior to discharge.  Final Clinical Impressions(s) / ED Diagnoses   Final diagnoses:  Pain  Epigastric pain    ED Discharge Orders         Ordered    pantoprazole (PROTONIX) 40 MG tablet  Daily     11/05/18 1530    sucralfate (CARAFATE) 1 g tablet  3 times daily with meals & bedtime    Note to Pharmacy:  Take for one week   11/05/18 1530           Gerhard Munch, MD 11/05/18 1530

## 2018-11-05 NOTE — ED Notes (Signed)
Patient verbalizes understanding of discharge instructions. Opportunity for questioning and answers were provided. 

## 2018-11-05 NOTE — ED Triage Notes (Signed)
Pt arrives via pov from UC after being seen for epigastric pain onset several days ago. Sent to rule out cholecystis.

## 2019-10-21 IMAGING — US ULTRASOUND ABDOMEN LIMITED
1 series · 14 of 25 positions shown · non-contrast
Comparison: None.

CLINICAL DATA: Epigastric pain for 3 days.

EXAM:
ULTRASOUND ABDOMEN LIMITED RIGHT UPPER QUADRANT

[Series 1: ultrasound abdomen limited · 14 of 27 slices shown]
[im 1/27]
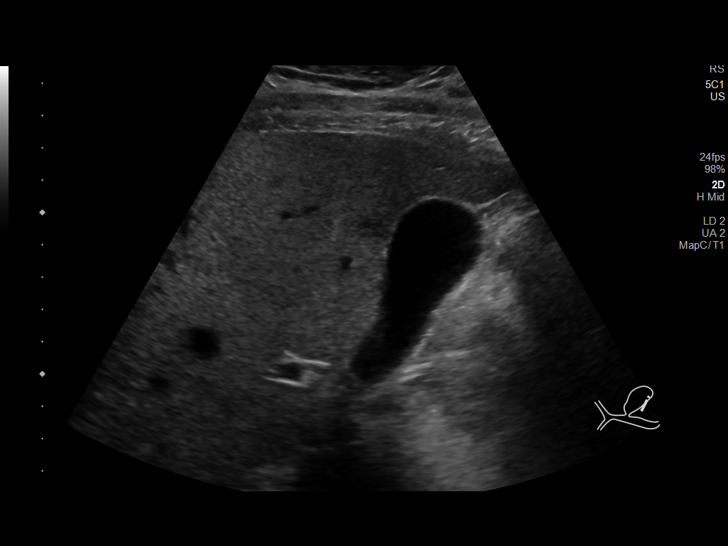
[im 3/27]
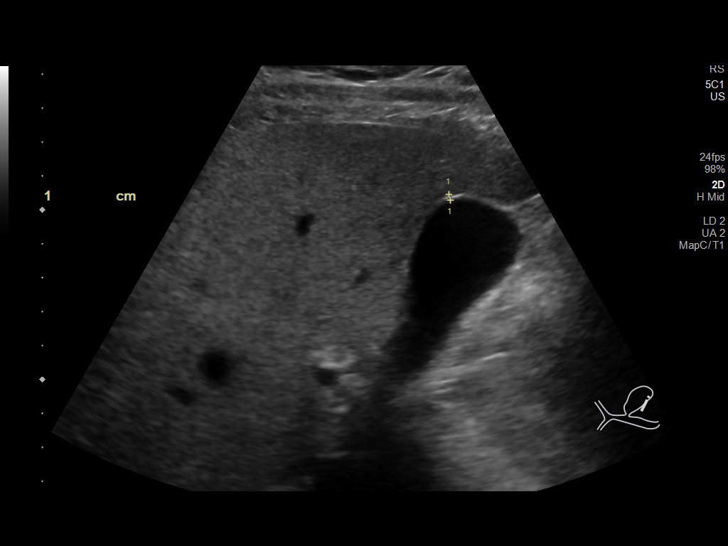
[im 5/27]
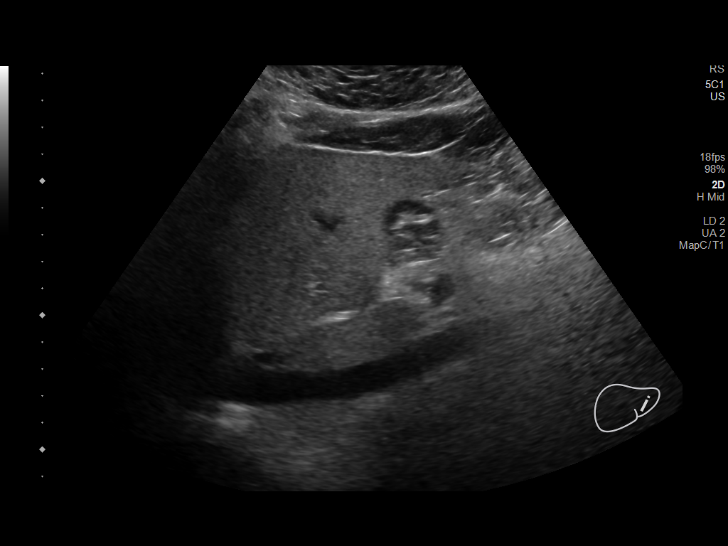
[im 7/27]
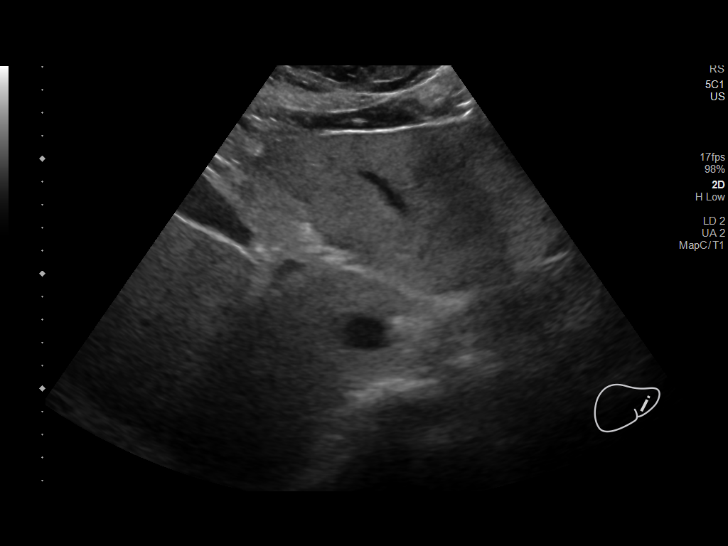
[im 9/27]
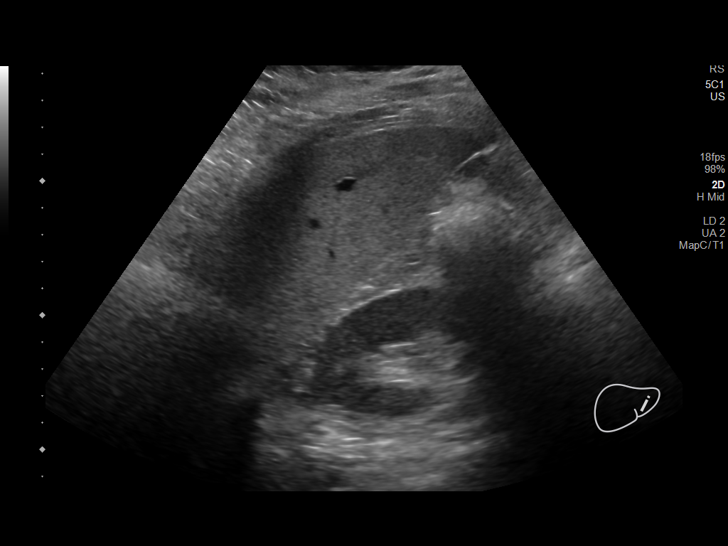
[im 10/27]
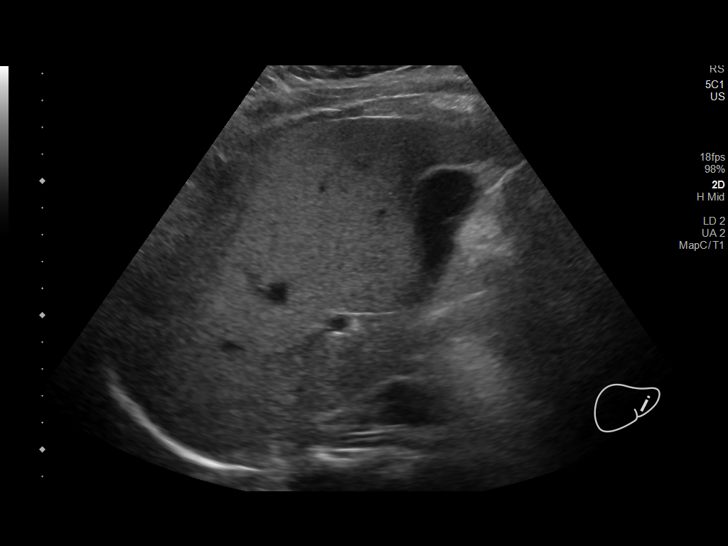
[im 12/27]
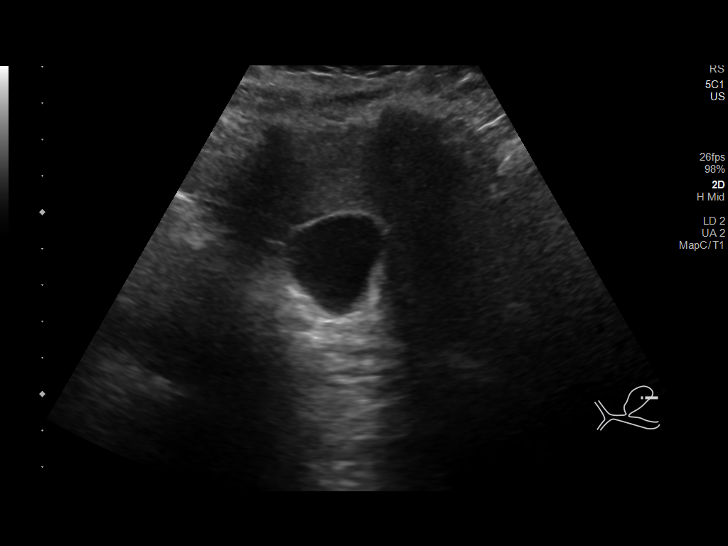
[im 15/27]
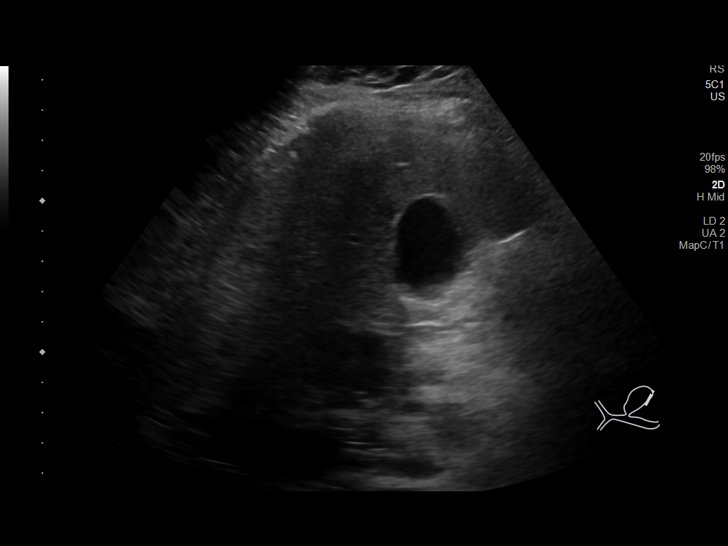
[im 17/27]
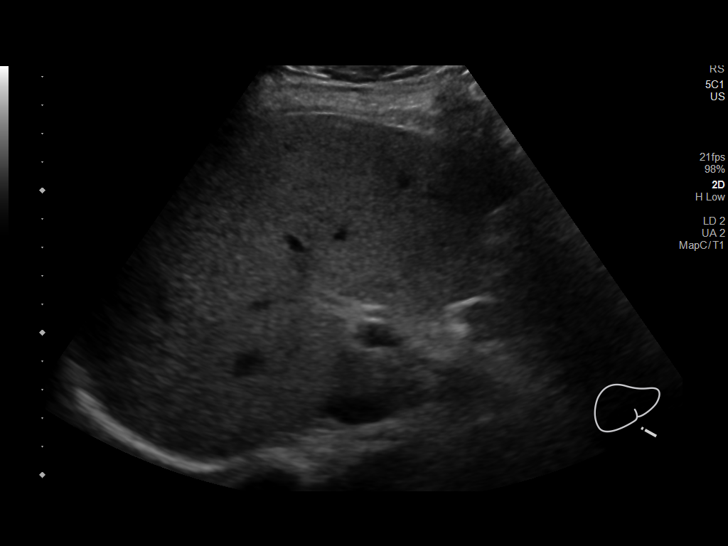
[im 18/27]
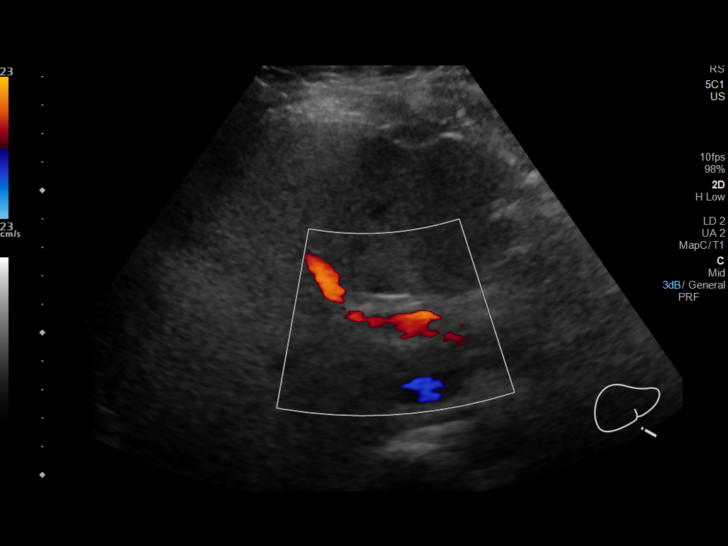
[im 20/27]
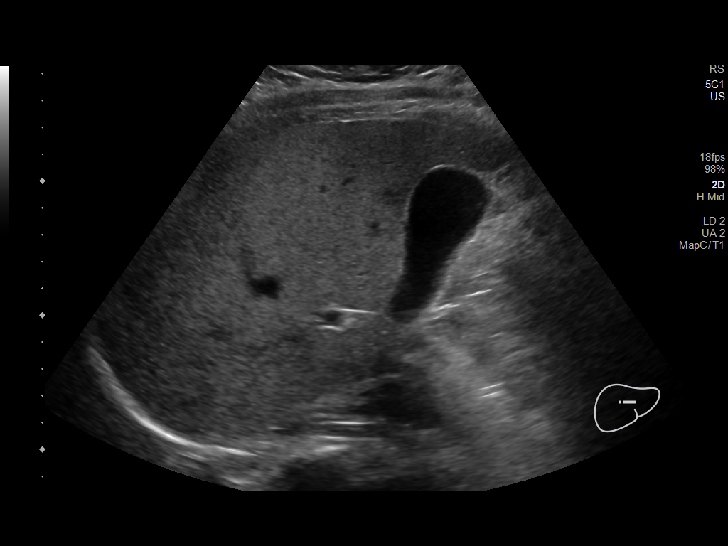
[im 22/27]
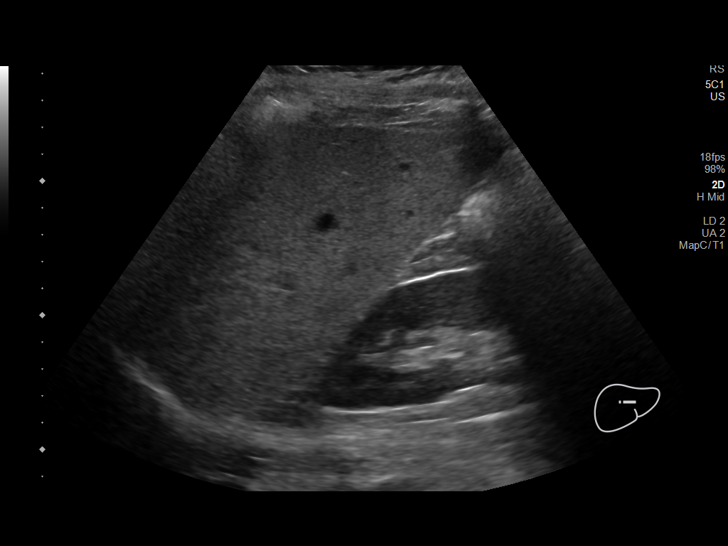
[im 24/27]
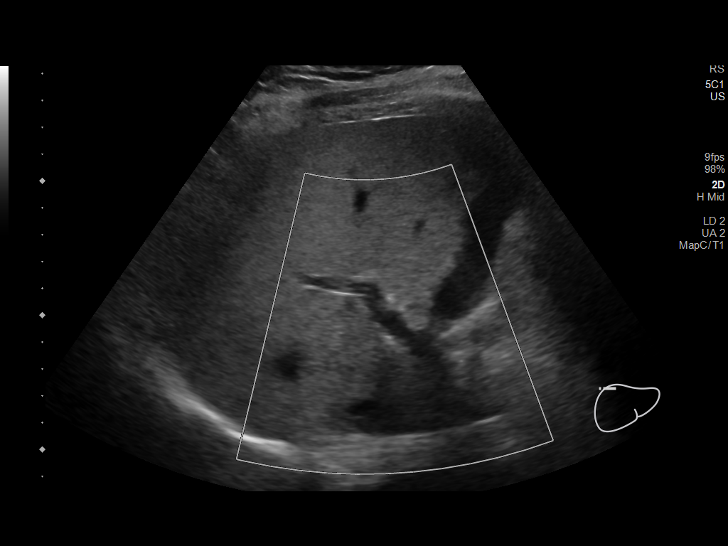
[im 27/27]
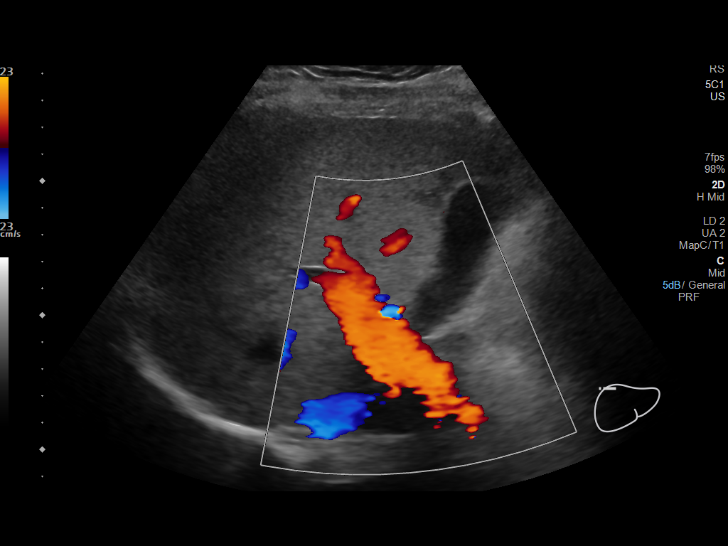

[14 of 25 positions shown; findings below may reference images not displayed]

FINDINGS: Gallbladder:

No gallstones or wall thickening visualized. No sonographic Murphy
sign noted by sonographer.

Common bile duct:

Diameter: 0.4 cm

Liver:

No focal lesion. Echogenicity is diffusely increased. Portal vein is
patent on color Doppler imaging with normal direction of blood flow
towards the liver.
IMPRESSION: No acute abnormality.  Negative for gallstones.

Fatty infiltration of the liver.

## 2021-06-03 ENCOUNTER — Ambulatory Visit (INDEPENDENT_AMBULATORY_CARE_PROVIDER_SITE_OTHER): Payer: 59 | Admitting: Obstetrics and Gynecology

## 2021-06-03 ENCOUNTER — Other Ambulatory Visit (HOSPITAL_COMMUNITY)
Admission: RE | Admit: 2021-06-03 | Discharge: 2021-06-03 | Disposition: A | Discharge status: Home / Self Care | Payer: 59 | Source: Ambulatory Visit | Attending: Obstetrics and Gynecology | Admitting: Obstetrics and Gynecology

## 2021-06-03 ENCOUNTER — Other Ambulatory Visit: Payer: Self-pay

## 2021-06-03 ENCOUNTER — Encounter: Payer: Self-pay | Admitting: Obstetrics and Gynecology

## 2021-06-03 VITALS — BP 137/89 | HR 77 | Ht 62.21 in | Wt 188.8 lb

## 2021-06-03 DIAGNOSIS — N898 Other specified noninflammatory disorders of vagina: Secondary | ICD-10-CM | POA: Insufficient documentation

## 2021-06-03 DIAGNOSIS — Z90710 Acquired absence of both cervix and uterus: Secondary | ICD-10-CM

## 2021-06-03 DIAGNOSIS — Z9079 Acquired absence of other genital organ(s): Secondary | ICD-10-CM | POA: Insufficient documentation

## 2021-06-03 DIAGNOSIS — N760 Acute vaginitis: Secondary | ICD-10-CM | POA: Insufficient documentation

## 2021-06-03 DIAGNOSIS — R1032 Left lower quadrant pain: Secondary | ICD-10-CM | POA: Diagnosis not present

## 2021-06-03 DIAGNOSIS — R109 Unspecified abdominal pain: Secondary | ICD-10-CM | POA: Insufficient documentation

## 2021-06-03 DIAGNOSIS — N951 Menopausal and female climacteric states: Secondary | ICD-10-CM

## 2021-06-03 DIAGNOSIS — K5900 Constipation, unspecified: Secondary | ICD-10-CM | POA: Diagnosis not present

## 2021-06-03 DIAGNOSIS — Z90722 Acquired absence of ovaries, bilateral: Secondary | ICD-10-CM

## 2021-06-03 MED ORDER — ESTRADIOL 0.06 MG/24HR TD PTWK: 1.0000 | MEDICATED_PATCH | TRANSDERMAL | 12 refills | Status: AC

## 2021-06-03 NOTE — Progress Notes (Signed)
  CC: abdominal pain Subjective:    Patient ID: Shari Martinez, female    DOB: [birth year], 40 y.o.   MRN: [id number]  Pelvic Pain The patient's primary symptoms include pelvic pain. Associated symptoms include abdominal pain and constipation. Pertinent negatives include no diarrhea, nausea or vomiting.  40 yo G2P2 seen for 1 year history of intermittent abdominal/pelvic pain. The pain is primarily in the left lower quadrant.  The pain is not every day, but when it comes it lasts 2-3 days.  She notes occasional constipation and no n/v.  Pt denies dysuria.  She notes occasional dyspareunia which she attributes to vaginal dryness.  Of note, the patient is s/p hysterectomy and bilateral salpingo-oophorectomy.  The ovaries were removed in separate procedures.  After removal of the second ovary, pt has been experiencing hot flashes and night sweats.  She is not on HRT.    Review of Systems  Respiratory: Negative.    Cardiovascular: Negative.   Gastrointestinal:  Positive for abdominal pain and constipation. Negative for diarrhea, nausea and vomiting.  Genitourinary:  Positive for dyspareunia and pelvic pain.  Musculoskeletal: Negative.   Neurological: Negative.       Objective:   Physical Exam Constitutional:      Appearance: Normal appearance.  HENT:     Head: Normocephalic and atraumatic.  Cardiovascular:     Rate and Rhythm: Normal rate and regular rhythm.     Heart sounds: Normal heart sounds.  Pulmonary:     Effort: Pulmonary effort is normal.     Breath sounds: Normal breath sounds.  Abdominal:     General: Abdomen is flat. There is no distension.     Palpations: Abdomen is soft. There is no mass.     Comments: Positive LLQ tenderness, mild  Genitourinary:    General: Normal vulva.     Vagina: No vaginal discharge.  Musculoskeletal:        General: Normal range of motion.  Neurological:     Mental Status: She is alert.  Psychiatric:        Mood and Affect: Mood normal.         Behavior: Behavior normal.   Vitals:   06/03/21 0834  BP: 137/89  Pulse: 77         Assessment & Plan:   1. Acute vaginitis Pt only noted vaginal irritation today.  No obvious discharge or lesion noted  - Cervicovaginal ancillary only( [place])  2. Left lower quadrant abdominal pain Pt has no gyn organs per her history.  Will get CT for confirmation and evaluation of bowels as well.  Pain unlikely from gyn source.  After CT may need referral to GI or general surgery  - CT ABDOMEN PELVIS W WO CONTRAST; Future  3. S/P total hysterectomy and BSO (bilateral salpingo-oophorectomy) CT for confirmation of removal, cervix was absent  4. Constipation, unspecified constipation type   5. Menopausal vasomotor syndrome Trial of estrogen patch for treatment of symptoms - estradiol (CLIMARA) 0.06 MG/24HR; Place 1 patch onto the skin once a week.  Dispense: 4 patch; Refill: 12  6. Vaginal irritation  - Cervicovaginal ancillary only( [place])  F/u in 5 weeks to review findings and see if the HRT was helpful  Warden Fillers, MD Faculty Attending, Center for [facility]

## 2021-06-04 LAB — CERVICOVAGINAL ANCILLARY ONLY
Bacterial Vaginitis (gardnerella): POSITIVE — AB
Chlamydia: NEGATIVE
Comment: NEGATIVE
Comment: NEGATIVE
Comment: NORMAL
Neisseria Gonorrhea: NEGATIVE

## 2021-06-06 ENCOUNTER — Telehealth: Payer: Self-pay | Admitting: General Practice

## 2021-06-06 DIAGNOSIS — B9689 Other specified bacterial agents as the cause of diseases classified elsewhere: Secondary | ICD-10-CM

## 2021-06-06 NOTE — Telephone Encounter (Signed)
Called patient with [facility] for interpreter, no answer- left message to call us back for results.

## 2021-06-06 NOTE — Telephone Encounter (Signed)
-----   Message from Warden Fillers, MD sent at 06/06/2021 11:32 AM EDT ----- Bacterial vaginosis seen on vaginal swab, will offer treatment

## 2021-06-07 ENCOUNTER — Ambulatory Visit (HOSPITAL_COMMUNITY): Admission: RE | Admit: 2021-06-07 | Payer: 59 | Source: Ambulatory Visit

## 2021-06-07 MED ORDER — METRONIDAZOLE 500 MG PO TABS: 500.0000 mg | ORAL_TABLET | Freq: Two times a day (BID) | ORAL | 0 refills | Status: AC

## 2021-06-07 NOTE — Telephone Encounter (Signed)
Patient called back into office stating she is returning our call. Informed patient of results & Rx sent to pharmacy. Patient verbalized understanding.

## 2021-06-07 NOTE — Telephone Encounter (Signed)
Called patient with Shari Martinez for interpreter, no answer- left message to call us back. Also called emergency contact and left message with him to have patient call us back.

## 2021-07-08 ENCOUNTER — Ambulatory Visit: Payer: 59

## 2021-07-08 ENCOUNTER — Ambulatory Visit: Payer: 59 | Admitting: Family Medicine

## 2022-12-24 ENCOUNTER — Other Ambulatory Visit: Payer: Self-pay

## 2022-12-24 DIAGNOSIS — G43E09 Chronic migraine with aura, not intractable, without status migrainosus: Secondary | ICD-10-CM

## 2022-12-30 ENCOUNTER — Other Ambulatory Visit: Payer: Self-pay | Admitting: Family Medicine

## 2022-12-30 DIAGNOSIS — G43E09 Chronic migraine with aura, not intractable, without status migrainosus: Secondary | ICD-10-CM

## 2023-01-13 ENCOUNTER — Encounter: Payer: Self-pay | Admitting: Family Medicine

## 2023-01-17 ENCOUNTER — Inpatient Hospital Stay: Admission: RE | Admit: 2023-01-17 | Payer: Self-pay | Source: Ambulatory Visit

## 2023-02-02 ENCOUNTER — Other Ambulatory Visit: Payer: Self-pay

## 2023-02-02 DIAGNOSIS — Z1231 Encounter for screening mammogram for malignant neoplasm of breast: Secondary | ICD-10-CM

## 2023-02-04 ENCOUNTER — Other Ambulatory Visit: Payer: Self-pay | Admitting: Family Medicine

## 2023-02-04 DIAGNOSIS — Z1231 Encounter for screening mammogram for malignant neoplasm of breast: Secondary | ICD-10-CM

## 2023-02-10 DIAGNOSIS — R87629 Unspecified abnormal cytological findings in specimens from vagina: Secondary | ICD-10-CM | POA: Insufficient documentation

## 2023-03-04 ENCOUNTER — Ambulatory Visit: Payer: BC Managed Care – PPO

## 2023-03-05 ENCOUNTER — Ambulatory Visit: Payer: BC Managed Care – PPO

## 2023-03-10 ENCOUNTER — Ambulatory Visit: Payer: BC Managed Care – PPO

## 2023-04-27 ENCOUNTER — Ambulatory Visit: Payer: BC Managed Care – PPO

## 2023-05-07 ENCOUNTER — Telehealth: Payer: Self-pay | Admitting: Family Medicine

## 2023-05-07 NOTE — Telephone Encounter (Signed)
Called patient to discuss appt with her but there was no answer and Patient VM is full.

## 2023-05-14 ENCOUNTER — Ambulatory Visit
Admission: RE | Admit: 2023-05-14 | Discharge: 2023-05-14 | Disposition: A | Discharge status: Home / Self Care | Payer: BC Managed Care – PPO | Source: Ambulatory Visit | Attending: Family Medicine | Admitting: Family Medicine

## 2023-05-14 DIAGNOSIS — Z1231 Encounter for screening mammogram for malignant neoplasm of breast: Secondary | ICD-10-CM

## 2023-05-25 ENCOUNTER — Encounter: Payer: Self-pay | Admitting: *Deleted

## 2023-06-18 ENCOUNTER — Ambulatory Visit: Payer: BC Managed Care – PPO | Admitting: Obstetrics and Gynecology

## 2023-06-30 ENCOUNTER — Encounter: Payer: Self-pay | Admitting: Family Medicine

## 2023-06-30 ENCOUNTER — Ambulatory Visit: Payer: BC Managed Care – PPO | Admitting: Family Medicine

## 2023-06-30 VITALS — BP 127/84 | HR 71 | Wt 196.6 lb

## 2023-06-30 DIAGNOSIS — Z90710 Acquired absence of both cervix and uterus: Secondary | ICD-10-CM

## 2023-06-30 DIAGNOSIS — Z8541 Personal history of malignant neoplasm of cervix uteri: Secondary | ICD-10-CM | POA: Diagnosis not present

## 2023-06-30 NOTE — Progress Notes (Signed)
   GYNECOLOGY OFFICE VISIT NOTE  History:   Shari Martinez is a 42 y.o. No obstetric history on file. here today for repeat pap. Pt reports hysterectomy with R sided oophorectomy in [place] 2/2 cervical cancer and ruptured R ovarian tumor. 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 No pathology on file and pt unable to get the records she reports.  She also states in 2018 she then had L sided oophorectomy given large ovarian cyst concern for ?torsion and "sticking to the sides."  She had co-testing 4 months ago with PCP that showed HPV negative, LSIL.  Patient states PCP said she would have repeat pap today. She denies any abnormal vaginal discharge, bleeding, pelvic pain or other concerns.    Past Medical History:  Diagnosis Date   Bright red blood per rectum 03/27/2014    Past Surgical History:  Procedure Laterality Date   ABDOMINAL HYSTERECTOMY      The following portions of the patient's history were reviewed and updated as appropriate: allergies, current medications, past family history, past medical history, past social history, past surgical history and problem list.   Health Maintenance:  LSIL pap and negative HRHPV on 02/04/23, due in 76mo.  Normal mammogram on 05/15/2023. Denies family hx of colon cancer.   Review of Systems:  Pertinent items noted in HPI and remainder of comprehensive ROS otherwise negative.  Physical Exam:  BP 127/84   Pulse 71   Wt 196 lb 9.6 oz (89.2 kg)   BMI 35.72 kg/m  CONSTITUTIONAL: Well-developed, well-nourished female in no acute distress.  HEENT:  Normocephalic, atraumatic. External right and left ear normal. No scleral icterus.  NECK: Normal range of motion, supple, no masses noted on observation SKIN: No rash noted. Not diaphoretic. No erythema. No pallor. MUSCULOSKELETAL: Normal range of motion. No edema noted. NEUROLOGIC: Alert and oriented to person, place, and time. Normal muscle tone  coordination.  PSYCHIATRIC: Normal mood and affect. Normal behavior. Normal judgment and thought content. CARDIOVASCULAR: Normal heart rate noted RESPIRATORY: Effort and breath sounds normal, no problems with respiration noted ABDOMEN: No masses noted. No other overt distention noted.   PELVIC: Deferred  Labs and Imaging No results found for this or any previous visit (from the past 168 hour(s)). No results found.    Assessment and Plan:      1. Hx of cervical cancer  2. Hx of hysterectomy   Given s/p hysterectomy for presumed cervical cancer (no pathology report) patient is safe to re-cotest a year from recent pap smear (due 01/2024) given HPV negative and only LSIL.    Routine preventative health maintenance measures emphasized. Please refer to After Visit Summary for other counseling recommendations.   Return in about 8 months (around 02/27/2024) for Cotest hx of cervical cancer s/p hysterectomy .     Hessie Dibble, MD OB Fellow, Faculty [facility], Center for [facility] 06/30/2023 11:19 AM
